# Patient Record
Sex: Male | Born: 1945 | Race: White | Hispanic: Yes | Marital: Married
Health system: Southern US, Community
[De-identification: ages and names within clinical notes are randomized; demographics above are authoritative.]

---

## 2008-09-06 ENCOUNTER — Inpatient Hospital Stay (HOSPITAL_COMMUNITY): Admission: EM | Admit: 2008-09-06 | Discharge: 2008-09-08 | Payer: Self-pay | Admitting: Emergency Medicine

## 2008-09-11 ENCOUNTER — Emergency Department (HOSPITAL_COMMUNITY): Admission: EM | Admit: 2008-09-11 | Discharge: 2008-09-11 | Payer: Self-pay | Admitting: Emergency Medicine

## 2008-10-06 ENCOUNTER — Emergency Department (HOSPITAL_COMMUNITY): Admission: EM | Admit: 2008-10-06 | Discharge: 2008-10-07 | Payer: Self-pay | Admitting: Emergency Medicine

## 2008-11-28 ENCOUNTER — Emergency Department (HOSPITAL_COMMUNITY): Admission: EM | Admit: 2008-11-28 | Discharge: 2008-11-28 | Payer: Self-pay | Admitting: Emergency Medicine

## 2010-06-26 LAB — POCT CARDIAC MARKERS
Myoglobin, poc: 46 ng/mL (ref 12–200)
Troponin i, poc: <0.05 ng/mL (ref 0.00–0.09)

## 2010-06-26 LAB — DIFFERENTIAL
Basophils Absolute: 0 K/uL (ref 0.0–0.1)
Basophils Relative: 1 % (ref 0–1)
Eosinophils Absolute: 0.2 10*3/uL (ref 0.0–0.7)
Eosinophils Relative: 3 % (ref 0–5)
Lymphocytes Relative: 30 % (ref 12–46)
Lymphs Abs: 1.7 10*3/uL (ref 0.7–4.0)
Monocytes Absolute: 0.6 K/uL (ref 0.1–1.0)
Monocytes Relative: 11 % (ref 3–12)
Neutro Abs: 3.2 K/uL (ref 1.7–7.7)
Neutrophils Relative %: 56 % (ref 43–77)

## 2010-06-26 LAB — CBC
HCT: 40.9 % (ref 39.0–52.0)
Hemoglobin: 14.3 g/dL (ref 13.0–17.0)
MCHC: 34.9 g/dL (ref 30.0–36.0)
MCV: 83.8 fL (ref 78.0–100.0)
Platelets: 228 K/uL (ref 150–400)
RBC: 4.88 MIL/uL (ref 4.22–5.81)
RDW: 14 % (ref 11.5–15.5)
WBC: 5.7 10*3/uL (ref 4.0–10.5)

## 2010-06-26 LAB — BASIC METABOLIC PANEL WITH GFR
BUN: 10 mg/dL (ref 6–23)
CO2: 23 meq/L (ref 19–32)
Calcium: 8.8 mg/dL (ref 8.4–10.5)
Creatinine, Ser: 0.85 mg/dL (ref 0.4–1.5)
GFR calc non Af Amer: >60 mL/min (ref >60)
Glucose, Bld: 106 mg/dL — ABNORMAL HIGH (ref 70–99)
Sodium: 141 meq/L (ref 135–145)

## 2010-06-26 LAB — BASIC METABOLIC PANEL
Chloride: 110 mEq/L (ref 96–112)
GFR calc Af Amer: >60 mL/min (ref >60)
Potassium: 4 mEq/L (ref 3.5–5.1)

## 2010-06-29 LAB — URINALYSIS, ROUTINE W REFLEX MICROSCOPIC
Glucose, UA: NEGATIVE mg/dL
Glucose, UA: NEGATIVE mg/dL
Hgb urine dipstick: NEGATIVE
Hgb urine dipstick: NEGATIVE
Ketones, ur: NEGATIVE mg/dL
Protein, ur: NEGATIVE mg/dL
Protein, ur: NEGATIVE mg/dL
Specific Gravity, Urine: 1.019 (ref 1.005–1.030)
Urobilinogen, UA: 0.2 mg/dL (ref 0.0–1.0)

## 2010-06-29 LAB — COMPREHENSIVE METABOLIC PANEL
ALT: 43 U/L (ref 0–53)
AST: 36 U/L (ref 0–37)
Albumin: 3.7 g/dL (ref 3.5–5.2)
Alkaline Phosphatase: 114 U/L (ref 39–117)
BUN: 11 mg/dL (ref 6–23)
GFR calc Af Amer: >60 mL/min (ref >60)
Potassium: 4.1 mEq/L (ref 3.5–5.1)
Sodium: 140 mEq/L (ref 135–145)
Total Protein: 7.4 g/dL (ref 6.0–8.3)

## 2010-06-29 LAB — DIFFERENTIAL
Basophils Relative: 0 % (ref 0–1)
Monocytes Absolute: 0.6 10*3/uL (ref 0.1–1.0)
Monocytes Relative: 6 % (ref 3–12)
Neutro Abs: 8.3 10*3/uL — ABNORMAL HIGH (ref 1.7–7.7)

## 2010-06-29 LAB — POCT I-STAT, CHEM 8
BUN: 11 mg/dL (ref 6–23)
Calcium, Ion: 0.99 mmol/L — ABNORMAL LOW (ref 1.12–1.32)
Creatinine, Ser: 0.8 mg/dL (ref 0.4–1.5)
Hemoglobin: 12.9 g/dL — ABNORMAL LOW (ref 13.0–17.0)
Sodium: 143 mEq/L (ref 135–145)
TCO2: 20 mmol/L (ref 0–100)

## 2010-06-29 LAB — BASIC METABOLIC PANEL
BUN: 7 mg/dL (ref 6–23)
Chloride: 107 mEq/L (ref 96–112)
Creatinine, Ser: 0.71 mg/dL (ref 0.4–1.5)
Glucose, Bld: 120 mg/dL — ABNORMAL HIGH (ref 70–99)
Potassium: 3.2 mEq/L — ABNORMAL LOW (ref 3.5–5.1)

## 2010-06-29 LAB — CBC
HCT: 34.9 % — ABNORMAL LOW (ref 39.0–52.0)
MCHC: 34.5 g/dL (ref 30.0–36.0)
MCV: 84.9 fL (ref 78.0–100.0)
MCV: 85 fL (ref 78.0–100.0)
Platelets: 209 10*3/uL (ref 150–400)
Platelets: 267 10*3/uL (ref 150–400)
RBC: 4.7 MIL/uL (ref 4.22–5.81)
RDW: 13.4 % (ref 11.5–15.5)
RDW: 13.7 % (ref 11.5–15.5)
WBC: 6.5 10*3/uL (ref 4.0–10.5)

## 2010-06-29 LAB — PROTIME-INR
INR: 1 (ref 0.00–1.49)
Prothrombin Time: 13.2 seconds (ref 11.6–15.2)

## 2010-06-29 LAB — TYPE AND SCREEN: ABO/RH(D): A POS

## 2010-06-29 LAB — APTT: aPTT: 25 seconds (ref 24–37)

## 2010-06-29 LAB — ABO/RH: ABO/RH(D): A POS

## 2010-06-29 LAB — LACTIC ACID, PLASMA: Lactic Acid, Venous: 1.5 mmol/L (ref 0.5–2.2)
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
## 2013-08-07 ENCOUNTER — Ambulatory Visit: Payer: Self-pay | Admitting: Family Medicine

## 2013-08-24 ENCOUNTER — Ambulatory Visit: Payer: Self-pay | Admitting: Family Medicine

## 2015-06-11 DIAGNOSIS — Z139 Encounter for screening, unspecified: Secondary | ICD-10-CM

## 2015-11-14 NOTE — Congregational Nurse Program (Signed)
Congregational Nurse Program Note  Date of Encounter: 06/11/2015  Past Medical History: No past medical history on file.  Encounter Details:  Client was assisted with [facility] 08/29/15. Pearletha AlfredJan Aaralynn Shepheard,RN CNP  [phone]

## 2017-06-02 ENCOUNTER — Encounter: Payer: Self-pay | Admitting: Medical

## 2018-08-08 ENCOUNTER — Other Ambulatory Visit: Payer: Self-pay

## 2018-08-08 ENCOUNTER — Encounter (HOSPITAL_COMMUNITY): Payer: Self-pay

## 2018-08-08 ENCOUNTER — Emergency Department (HOSPITAL_COMMUNITY): Payer: Self-pay

## 2018-08-08 ENCOUNTER — Emergency Department (HOSPITAL_COMMUNITY)
Admission: EM | Admit: 2018-08-08 | Discharge: 2018-08-08 | Disposition: A | Discharge status: Home / Self Care | Payer: Self-pay | Attending: Emergency Medicine | Admitting: Emergency Medicine

## 2018-08-08 DIAGNOSIS — S298XXA Other specified injuries of thorax, initial encounter: Secondary | ICD-10-CM | POA: Insufficient documentation

## 2018-08-08 DIAGNOSIS — R51 Headache: Secondary | ICD-10-CM | POA: Insufficient documentation

## 2018-08-08 DIAGNOSIS — S29012A Strain of muscle and tendon of back wall of thorax, initial encounter: Secondary | ICD-10-CM | POA: Insufficient documentation

## 2018-08-08 DIAGNOSIS — M25512 Pain in left shoulder: Secondary | ICD-10-CM | POA: Insufficient documentation

## 2018-08-08 DIAGNOSIS — M25561 Pain in right knee: Secondary | ICD-10-CM | POA: Insufficient documentation

## 2018-08-08 DIAGNOSIS — Y92410 Unspecified street and highway as the place of occurrence of the external cause: Secondary | ICD-10-CM | POA: Insufficient documentation

## 2018-08-08 DIAGNOSIS — Y9389 Activity, other specified: Secondary | ICD-10-CM | POA: Insufficient documentation

## 2018-08-08 DIAGNOSIS — M25562 Pain in left knee: Secondary | ICD-10-CM | POA: Insufficient documentation

## 2018-08-08 DIAGNOSIS — M542 Cervicalgia: Secondary | ICD-10-CM | POA: Insufficient documentation

## 2018-08-08 DIAGNOSIS — Y999 Unspecified external cause status: Secondary | ICD-10-CM | POA: Insufficient documentation

## 2018-08-08 MED ORDER — ACETAMINOPHEN 500 MG PO TABS
1000.0000 mg | ORAL_TABLET | Freq: Once | ORAL | Status: AC
  Administered 2018-08-08: 1000 mg via ORAL
  Filled 2018-08-08: qty 2

## 2018-08-08 MED ORDER — ACETAMINOPHEN 500 MG PO TABS: 1000.0000 mg | ORAL_TABLET | Freq: Four times a day (QID) | ORAL | 0 refills | Status: AC | PRN

## 2018-08-08 MED ORDER — METHOCARBAMOL 500 MG PO TABS: 500.0000 mg | ORAL_TABLET | Freq: Three times a day (TID) | ORAL | 0 refills | Status: AC | PRN
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
## 2018-08-08 NOTE — Discharge Instructions (Addendum)
1.  Take Tylenol every 6 hours as needed for pain.  You may also take the muscle relaxer, Robaxin, prescribed for muscle spasms and strain. Place Ice ice packs on areas of pain for the first 2 days.  After that, warm moist heat helps relieve pain. 2.  Make an appointment to see your family doctor within the next 2 to 3 days. 3.  Muscle pain is often worse 3 to 5 days after an injury.  This is when a lot of muscle spasm bruising becomes more painful. 4.  Return to the emergency department if you have worsening chest pain, difficulty breathing, feeling lightheaded, see blood in your urine or bowel movement.

## 2018-08-08 NOTE — ED Notes (Signed)
Nurse navigator spoke with granddaughter [phone] and updated plan of care.
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
